# Patient Record
Sex: Female | Born: 1955 | Race: Black or African American | Hispanic: No | Marital: Single | Smoking: Never smoker
Health system: Southern US, Community
[De-identification: ages and names within clinical notes are randomized; demographics above are authoritative.]

---

## 2003-02-01 ENCOUNTER — Encounter: Payer: Self-pay | Admitting: Neurology

## 2003-02-01 ENCOUNTER — Inpatient Hospital Stay (HOSPITAL_COMMUNITY): Admission: EM | Admit: 2003-02-01 | Discharge: 2003-02-03 | Payer: Self-pay | Admitting: Emergency Medicine

## 2003-02-02 ENCOUNTER — Encounter (INDEPENDENT_AMBULATORY_CARE_PROVIDER_SITE_OTHER): Payer: Self-pay | Admitting: Cardiology

## 2003-02-09 ENCOUNTER — Encounter: Admission: RE | Admit: 2003-02-09 | Discharge: 2003-02-21 | Payer: Self-pay | Admitting: Neurology

## 2004-06-12 ENCOUNTER — Encounter: Admission: RE | Admit: 2004-06-12 | Discharge: 2004-06-12 | Payer: Self-pay | Admitting: Family Medicine

## 2005-04-14 ENCOUNTER — Other Ambulatory Visit: Admission: RE | Admit: 2005-04-14 | Discharge: 2005-04-14 | Payer: Self-pay | Admitting: Family Medicine

## 2006-09-28 ENCOUNTER — Other Ambulatory Visit: Admission: RE | Admit: 2006-09-28 | Discharge: 2006-09-28 | Payer: Self-pay | Admitting: Family Medicine

## 2006-10-05 ENCOUNTER — Encounter: Admission: RE | Admit: 2006-10-05 | Discharge: 2006-10-05 | Payer: Self-pay | Admitting: Family Medicine

## 2007-09-23 ENCOUNTER — Other Ambulatory Visit: Admission: RE | Admit: 2007-09-23 | Discharge: 2007-09-23 | Payer: Self-pay | Admitting: Family Medicine

## 2007-11-09 ENCOUNTER — Encounter: Admission: RE | Admit: 2007-11-09 | Discharge: 2007-11-09 | Payer: Self-pay | Admitting: Family Medicine

## 2008-12-19 ENCOUNTER — Encounter: Admission: RE | Admit: 2008-12-19 | Discharge: 2008-12-19 | Payer: Self-pay | Admitting: Family Medicine

## 2010-09-05 ENCOUNTER — Other Ambulatory Visit: Admission: RE | Admit: 2010-09-05 | Discharge: 2010-09-05 | Payer: Self-pay | Admitting: Family Medicine

## 2011-01-19 ENCOUNTER — Encounter: Payer: Self-pay | Admitting: Family Medicine

## 2011-05-16 NOTE — H&P (Signed)
NAME:  Kaylee Hughes, Kaylee Hughes                        ACCOUNT NO.:  [id number]   MEDICAL RECORD NO.:  [id number]                   PATIENT TYPE:  EMS   LOCATION:  MAJO                                 FACILITY:  [facility]   PHYSICIAN:  Genene Churn. Love, M.D.                 DATE OF BIRTH:  [birth year]   DATE OF ADMISSION:  02/01/2003  DATE OF DISCHARGE:                                HISTORY & PHYSICAL   DATE OF BIRTH:  Jan 26, 1956   CHIEF COMPLAINT:  This is the second [facility] admission for this  55 year-old right handed black single female from [place], [place]  admitted from the emergency room for evaluation of dysarthric and a  suspected stroke.   HISTORY OF PRESENT ILLNESS:  This patient has a greater than ten year  history of hypertension and a 30 year history of cigarette use.  In 1997 she  developed right sided weakness from a left brain stroke and was admitted by  Dr. Amada Jupiter Dreiling and followed by him until approximately two years ago.  She had excellent recovery of her symptomatology, but approximately one year  to six months ago discontinued medications.  This day about 7:30 a.m. while  at work, she noted the onset of dysarthria with left hand tingling and  weakness unassociated with headache, seizure, syncope, chest pain or  palpitations.  There is no history of head or neck trauma, drug or alcohol  abuse.  She had not been taking aspirin and comes to the emergency room for  further evaluation.   PAST MEDICAL HISTORY:  1. Ten year history of hypertension.  2. Chronic obstructive pulmonary disease with a 30 year history of one     quarter pack per day cigarette use.  3. Left brain stroke in 1997.   EDUCATION:  She went through the twelfth grade school.  She works as a  Engineer, agricultural at [employer].  She smokes a quarter  pack per day of cigarettes which she has done for 30 years.   ALLERGIES:  She has no known history of  allergies.   HABITS:  She does not drink alcohol.   MEDICATIONS:  She is currently on no medications.   FAMILY HISTORY:  Her mother died at 62 from Alzheimer's disease.  Her father  died in his 58's of unknown causes.  She has one brother who is 39, another  brother who is 68 and sisters who are 55 and 28 who are living and well.  She has four children of her own; daughters are 55, 75 and 59 and a son 8,  living and well.   PHYSICAL EXAMINATION:  GENERAL:  This is a well developed pleasant female  with marked dysarthria who appeared her stated age.  VITAL SIGNS:  Lying blood pressure in the right and the left arms was  195/100, heart rate 90 and regular  and she is afebrile.  HEENT:  There is a loud left supraclavicular bruit heard.  The tympanic  membranes were clear.  Neck flexion was intact.  MENTAL STATUS:  She is alert and oriented times three.  There was no  aphasia, agnosia or apraxia.  There was no denial of the left side of her  body.  She followed one, two and three step commands.  Her cranial nerve  examination revealed visual fields to be full.  Discs are flat.  Spontaneous  venous pulsations are seen.  The left palpebra is larger than the right.  Dysarthria.  Tongue is midline.  Uvula midline. Gag is present.  The  sternocleidomastoid and trapezius are normal.  MOTOR EXAMINATION:  Mild left hand extensor drift, otherwise strength  testing is approximately 4++/5 on the left.  SENSORY EXAMINATION:  Intact to pinprick, position and vibration testing.  Deep tendon reflexes are 2+.  Plantar responses are downgoing.  Gait was not  tested.   GENERAL EXAMINATION:  HEENT:  Tympanic membranes are clear.  Mouth is in  good repair.  Tongue is midline.  LUNGS:  Clear to auscultation.  CARDIOVASCULAR:  Examination of her heart revealed no murmurs.  BREASTS:  Normal.  ABDOMEN:  Normal with normal bowel sounds.   LABORATORY DATA:  CT scan showed an old right frontal and left head  of  caudate infarcts extending into the white matter on the left side which is  very large.  She had right greater than left cerebellar ischemic strokes.   EKG showed normal sinus rhythm and was a normal EKG.   White blood cell count is 9,700, hemoglobin 14.1, hematocrit 40.5, platelets  293,000.  Sodium 139, potassium 3.4, chloride 106, C02 content 26, BUN 8,  creatinine 0.7, glucose 96.  PT 13.2, INR 1.0, PTT 33.0.   IMPRESSION:  1. Right brain stroke (code 424.01).  2. Left brain stroke in 1997 (code 424.01).  3. Bicerebral small vessel ischemic strokes on CT scan and possibly large     vessel as well (code 433.31).  4. Hypertension (code 796.2).  5. Chronic obstructive pulmonary disease with a quarter pack per day     cigarette use times 30 years (code 496).  6. Left supraclavicular bruit (code 785.9).   PLAN:  The plan at this time is to admit the patient for further evaluation  to include an MRI, intracranial MRA Doppler, 2D echocardiogram and place the  patient on aspirin and control her blood pressure with diuretics at this  time.                                               Genene Churn. Sandria Manly, M.D.    JML/MEDQ  D:  02/01/2003  T:  02/01/2003  Job:  [id number]

## 2011-05-16 NOTE — Discharge Summary (Signed)
NAME:  Kaylee Hughes, Kaylee Hughes                        ACCOUNT NO.:  [id number]   MEDICAL RECORD NO.:  [id number]                   PATIENT TYPE:  INP   LOCATION:  3015                                 FACILITY:  [facility]   PHYSICIAN:  Gustavus Messing. Orlin Hilding, M.D.          DATE OF BIRTH:  1956/05/06   DATE OF ADMISSION:  02/01/2003  DATE OF DISCHARGE:  02/03/2003                                 DISCHARGE SUMMARY   ADMITTING DIAGNOSES:  1. Stroke, right brain, suspected.  2. History of small-vessel disease and remote left brain stroke.  3. Hypertension.   DISCHARGE DIAGNOSES:  1. 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  2. Old bicerebral strokes.  3. Hypertension.  4. Chronic obstructive pulmonary disease.  5. Left supraclavicular bruit.   MEDICATIONS ON DISCHARGE:  1. Hydrochlorothiazide 25 mg one a day.  2. Aspirin 325 mg one a day.  3. Potassium chloride 20 mEq one a day.   PAIN MANAGEMENT:  Not applicable.   ACTIVITY:  The patient should remain out of work until she has seen Dr. Sandria Manly  in two to three weeks.   DIET ON DISCHARGE:  Dysphagia III thin liquid.   DISCHARGE INSTRUCTIONS:  1. Speech and swallow evaluation at [facility] outpatient rehab for follow-up and     evaluation at [facility] for primary care.  2. No smoking.  She may use over-the-counter smoking cessation aids.   FOLLOW-UP:  With Dr. Sandria Manly with [facility] in two to three weeks;  phone number given to the patient.   PROCEDURES:  1. MRI scan of the brain which showed an acute right thalamic stroke and     small-vessel disease.  The thalamic stroke is a lacunar infarct.  There     is also a left basal ganglia periventricular centrum semiovale infarct     which is old.  2. Carotid Doppler studies were done which were negative for stenosis.  3. A 2-D echo was done which showed some left ventricular hypertrophy, left     ventricular ejection fraction of 65-75% which is excellent; no  wall     abnormality, no wall motion abnormality, and no embolus or clot seen.   LABORATORY DATA:  Elevated cholesterol 233 - although that was random.  HDL  was 54, LDL was 155.  She will be on a low cholesterol diet and that will  need to be followed as an outpatient.  CBC was completely normal:  White  blood cell 9.7, hemoglobin 14.1, hematocrit 40.5, platelets 293.  PT 13.2,  INR 1.0, PTT 33.  Routine chemistries:  BMET was normal except slightly low  potassium at 3.4.  Homocysteine was within normal limits at 10.88.  Urinalysis did show some nitrites and esterase but did not show abnormal  white blood cells.  She had a negative drugs of abuse screen.   HOSPITAL COURSE:  Please see History and Physical for details.  Briefly,  this is a 55 year old woman with a history of hypertension who is not being  treated who presented with dysarthria, possible stroke.  She had a previous  history of a stroke in 1997, was followed by a primary physician for two  years, and then discontinued her care there.  She was admitted when she  developed left hand tingling and weakness with dysarthria.  MRI demonstrated  an acute stroke.  She was seen by speech and language pathology and put on  modified diet.  Workup to exclude embolic sources from the carotid or from  the heart were done and those were negative.  Everything was consistent with  small-vessel disease; she had not been taking any antiplatelet agents.  She  was placed on aspirin.  She is to follow up at [facility] for primary  care.  Her diet will be dysphagia III with thin liquids, low cholesterol,  low fat.  She will need to follow up with the [facility] rehab outpatient center  for follow-up swallow and speech, and follow-up with Dr. Sandria Manly as already  noted.  She is discharged in improved condition, ambulatory, with mild  dysarthria and dysphagia.                                               Catherine A. Orlin Hilding, M.D.    CAW/MEDQ  D:   02/03/2003  T:  02/04/2003  Job:  [id number]

## 2012-09-20 ENCOUNTER — Encounter (HOSPITAL_COMMUNITY): Payer: Self-pay | Admitting: *Deleted

## 2012-09-28 ENCOUNTER — Ambulatory Visit (HOSPITAL_COMMUNITY): Payer: Self-pay

## 2012-09-28 ENCOUNTER — Ambulatory Visit (HOSPITAL_COMMUNITY): Payer: Self-pay | Attending: Obstetrics and Gynecology

## 2012-09-28 ENCOUNTER — Other Ambulatory Visit: Payer: Self-pay | Admitting: Obstetrics and Gynecology

## 2012-09-28 DIAGNOSIS — Z1231 Encounter for screening mammogram for malignant neoplasm of breast: Secondary | ICD-10-CM

## 2014-08-01 ENCOUNTER — Other Ambulatory Visit: Payer: Self-pay | Admitting: Dermatology

## 2019-09-01 ENCOUNTER — Ambulatory Visit
Admission: RE | Admit: 2019-09-01 | Discharge: 2019-09-01 | Disposition: A | Discharge status: Home / Self Care | Payer: BC Managed Care – PPO | Source: Ambulatory Visit | Attending: Family Medicine | Admitting: Family Medicine

## 2019-09-01 ENCOUNTER — Other Ambulatory Visit: Payer: Self-pay

## 2019-09-01 ENCOUNTER — Other Ambulatory Visit: Payer: Self-pay | Admitting: Family Medicine

## 2019-09-01 DIAGNOSIS — M25361 Other instability, right knee: Secondary | ICD-10-CM

## 2019-09-06 ENCOUNTER — Other Ambulatory Visit: Payer: Self-pay

## 2019-09-06 ENCOUNTER — Emergency Department (HOSPITAL_COMMUNITY)
Admission: EM | Admit: 2019-09-06 | Discharge: 2019-09-07 | Disposition: A | Discharge status: Home / Self Care | Payer: BLUE CROSS/BLUE SHIELD | Attending: Emergency Medicine | Admitting: Emergency Medicine

## 2019-09-06 ENCOUNTER — Encounter (HOSPITAL_COMMUNITY): Payer: Self-pay | Admitting: Emergency Medicine

## 2019-09-06 DIAGNOSIS — K222 Esophageal obstruction: Secondary | ICD-10-CM

## 2019-09-06 DIAGNOSIS — Y9389 Activity, other specified: Secondary | ICD-10-CM | POA: Insufficient documentation

## 2019-09-06 DIAGNOSIS — Y999 Unspecified external cause status: Secondary | ICD-10-CM | POA: Diagnosis not present

## 2019-09-06 DIAGNOSIS — T18128A Food in esophagus causing other injury, initial encounter: Secondary | ICD-10-CM | POA: Diagnosis not present

## 2019-09-06 DIAGNOSIS — X58XXXA Exposure to other specified factors, initial encounter: Secondary | ICD-10-CM | POA: Diagnosis not present

## 2019-09-06 DIAGNOSIS — Y92011 Dining room of single-family (private) house as the place of occurrence of the external cause: Secondary | ICD-10-CM | POA: Diagnosis not present

## 2019-09-06 NOTE — ED Notes (Signed)
Pt tolerated PO intake. Reports no pain or discomfort, states she is ready to go home.

## 2019-09-06 NOTE — ED Triage Notes (Signed)
C/O of food bolus. Pt swallowed piece of steak this evening and wasn't able to get It down completely.

## 2019-09-06 NOTE — ED Provider Notes (Signed)
TIME SEEN: 11:00 PM  CHIEF COMPLAINT: Food bolus  HPI: Patient is a 63 year old female with no significant past medical history who presents to the emergency department with esophageal food bolus.  States around 7 PM she was eating steak when she felt to get stuck in her esophagus.  Was unable to swallow her own saliva afterwards.  Waited a couple of hours at home before coming to the ED.  Has never had this happen to her before.  She has never had an endoscopy.  Has never seen gastroenterology.  No difficulty breathing.  No abdominal pain.  While in the waiting room, symptoms spontaneously resolved and she feels she can swallow her own secretions.  ROS: See HPI Constitutional: no fever  Eyes: no drainage  ENT: no runny nose   Cardiovascular:  no chest pain  Resp: no SOB  GI: no vomiting GU: no dysuria Integumentary: no rash  Allergy: no hives  Musculoskeletal: no leg swelling  Neurological: no slurred speech ROS otherwise negative  PAST MEDICAL HISTORY/PAST SURGICAL HISTORY:  History reviewed. No pertinent past medical history.  MEDICATIONS:  Prior to Admission medications   Not on File    ALLERGIES:  No Known Allergies  SOCIAL HISTORY:  Social History   Tobacco Use  . Smoking status: Never Smoker  . Smokeless tobacco: Never Used  Substance Use Topics  . Alcohol use: Not Currently    FAMILY HISTORY: History reviewed. No pertinent family history.  EXAM: BP 132/70 (BP Location: Right Arm)   Pulse 63   Temp 98.8 F (37.1 C) (Oral)   Resp 16   SpO2 98%  CONSTITUTIONAL: Alert and oriented and responds appropriately to questions. Well-appearing; well-nourished HEAD: Normocephalic EYES: Conjunctivae clear, pupils appear equal, EOMI ENT: normal nose; moist mucous membranes; No pharyngeal erythema or petechiae, no tonsillar hypertrophy or exudate, no uvular deviation, no unilateral swelling, no trismus or drooling, no muffled voice, normal phonation, no stridor, no  dental caries present, no drainable dental abscess noted, no Ludwig's angina, tongue sits flat in the bottom of the mouth, no angioedema, no facial erythema or warmth, no facial swelling; no pain with movement of the neck. NECK: Supple, no meningismus, no nuchal rigidity, no LAD  CARD: RRR; S1 and S2 appreciated; no murmurs, no clicks, no rubs, no gallops RESP: Normal chest excursion without splinting or tachypnea; breath sounds clear and equal bilaterally; no wheezes, no rhonchi, no rales, no hypoxia or respiratory distress, speaking full sentences ABD/GI: Normal bowel sounds; non-distended; soft, non-tender, no rebound, no guarding, no peritoneal signs, no hepatosplenomegaly BACK:  The back appears normal and is non-tender to palpation, there is no CVA tenderness EXT: Normal ROM in all joints; non-tender to palpation; no edema; normal capillary refill; no cyanosis, no calf tenderness or swelling    SKIN: Normal color for age and race; warm; no rash NEURO: Moves all extremities equally PSYCH: The patient's mood and manner are appropriate. Grooming and personal hygiene are appropriate.  MEDICAL DECISION MAKING: Patient here with esophageal food bolus that has resolved.  She is able to drink Sprite in the room without any difficulty and no gagging, vomiting, regurgitation.  I feel she is safe to be discharged home.  Recommended soft diet for the next several days and follow-up with GI if this reoccurs.  Discussed return precautions.  Patient and daughter are comfortable with this plan.  She is currently asymptomatic.  At this time, I do not feel there is any life-threatening condition present. I have reviewed  and discussed all results (EKG, imaging, lab, urine as appropriate) and exam findings with patient/family. I have reviewed nursing notes and appropriate previous records.  I feel the patient is safe to be discharged home without further emergent workup and can continue workup as an outpatient as  needed. Discussed usual and customary return precautions. Patient/family verbalize understanding and are comfortable with this plan.  Outpatient follow-up has been provided as needed. All questions have been answered.      Ward, Delice Bison, DO 09/06/19 [id number]

## 2019-09-07 NOTE — Discharge Instructions (Signed)
I recommend soft diet for 3 days and then slowly advance.  Please follow up with GI if you have recurrent symptoms as you may need an endoscopy.  If this occurs again at home and unrelieved and you are unable to swallow your own saliva, please return to the ED.

## 2019-09-07 NOTE — ED Notes (Signed)
All appropriate discharge materials reviewed with patient at length. Time for questions provided. Pt denies any further questions at this time. Verbalizes understanding of all provided materials.  

## 2019-09-14 ENCOUNTER — Other Ambulatory Visit: Payer: Self-pay | Admitting: Family Medicine

## 2019-09-14 DIAGNOSIS — Z1231 Encounter for screening mammogram for malignant neoplasm of breast: Secondary | ICD-10-CM

## 2020-01-13 ENCOUNTER — Ambulatory Visit: Payer: BLUE CROSS/BLUE SHIELD | Attending: Internal Medicine

## 2020-01-13 DIAGNOSIS — Z20822 Contact with and (suspected) exposure to covid-19: Secondary | ICD-10-CM

## 2020-01-14 LAB — NOVEL CORONAVIRUS, NAA: SARS-CoV-2, NAA: NOT DETECTED

## 2020-01-27 ENCOUNTER — Ambulatory Visit: Payer: BLUE CROSS/BLUE SHIELD | Attending: Internal Medicine

## 2020-01-27 DIAGNOSIS — Z20822 Contact with and (suspected) exposure to covid-19: Secondary | ICD-10-CM

## 2020-01-28 LAB — NOVEL CORONAVIRUS, NAA: SARS-CoV-2, NAA: NOT DETECTED

## 2021-01-31 IMAGING — CR DG KNEE COMPLETE 4+V*R*
4 series · 4 of 4 positions shown · non-contrast
Comparison: None.

CLINICAL DATA: Right knee pain for 2 months without known injury.

EXAM:
RIGHT KNEE - COMPLETE 4+ VIEW

[w knee ap right]
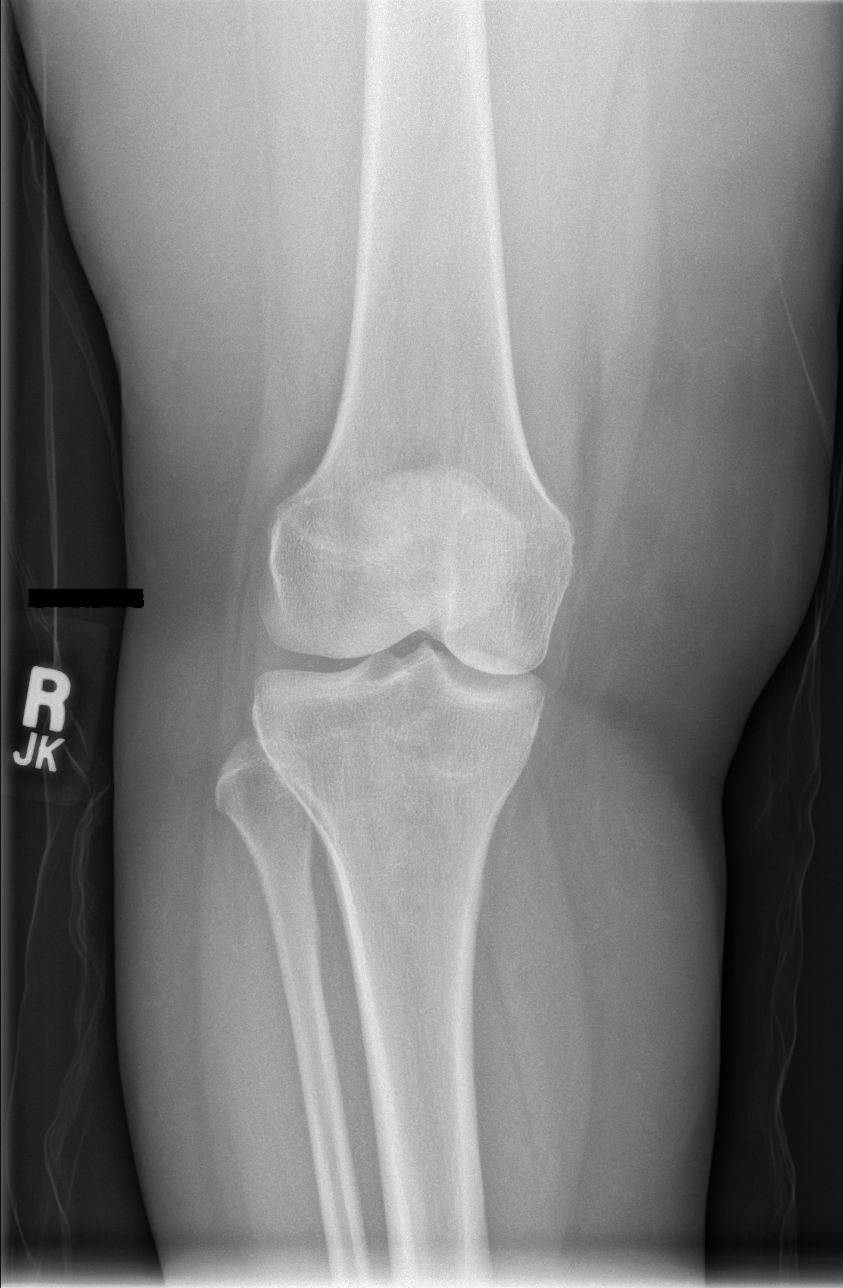

[w knee lat right (1 of 2)]
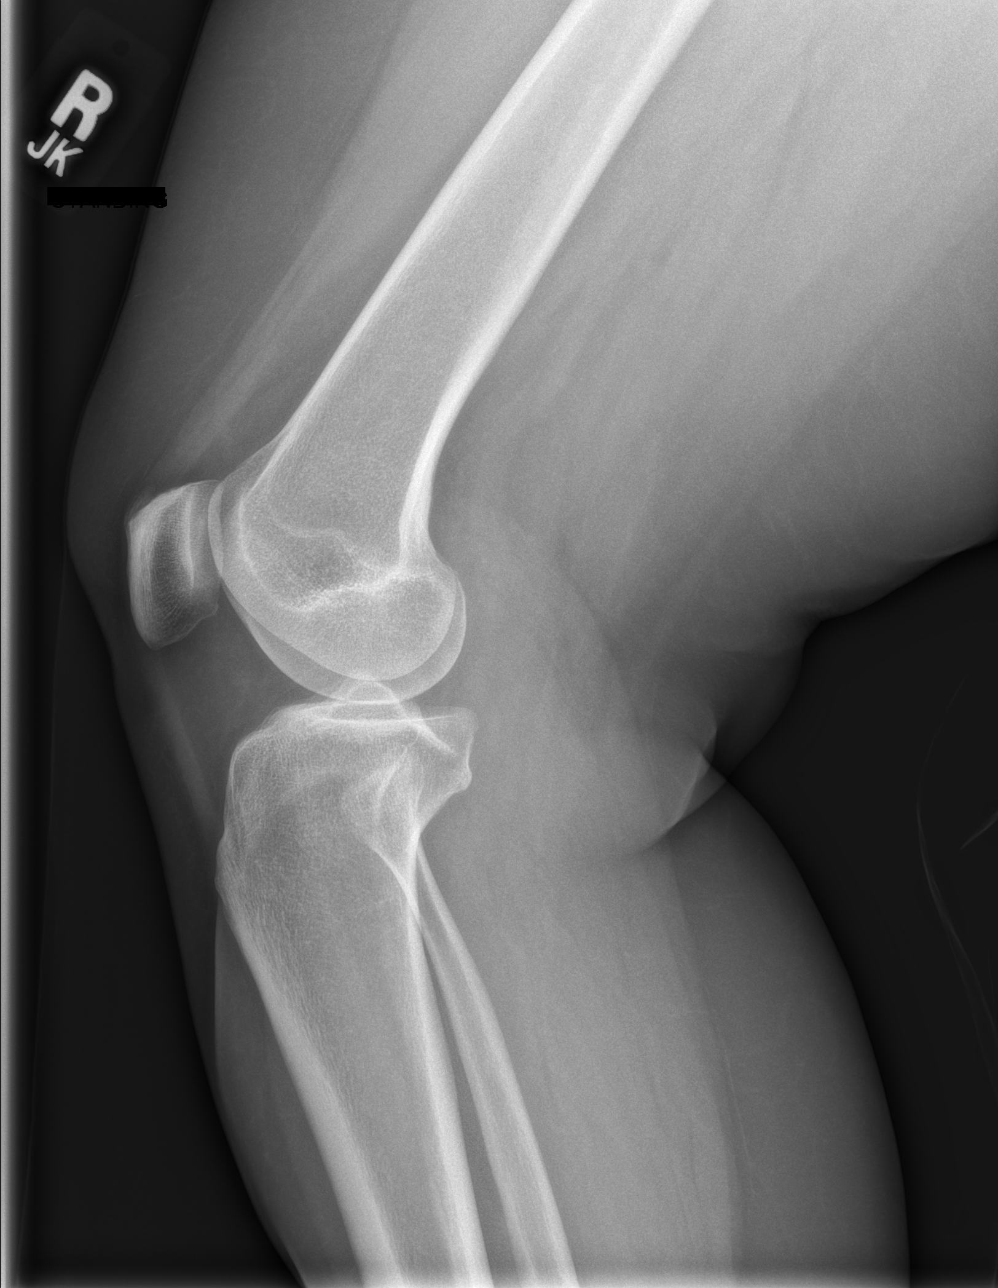

[w knee lat right (2 of 2)]
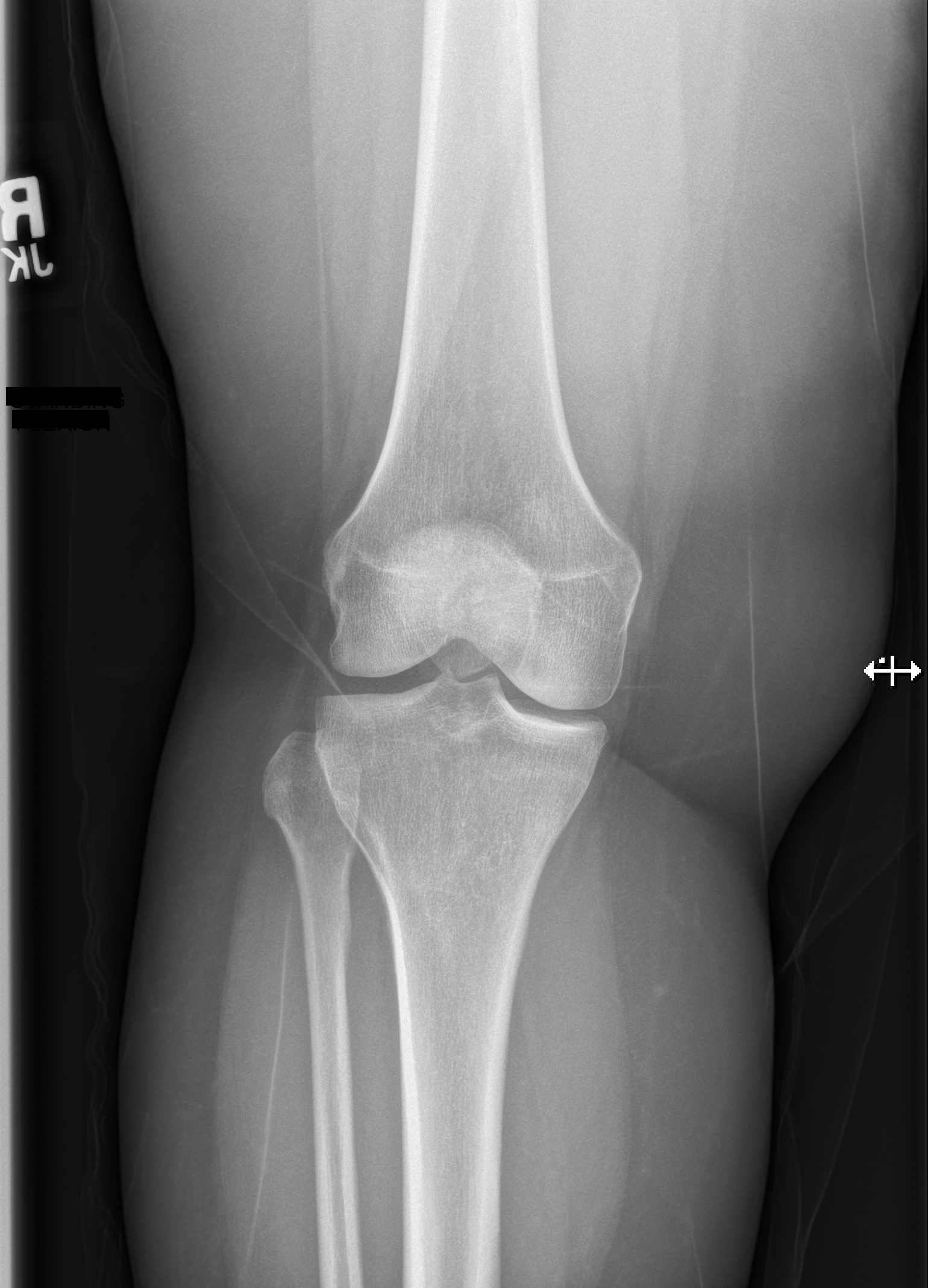

[x knee sunrise right]
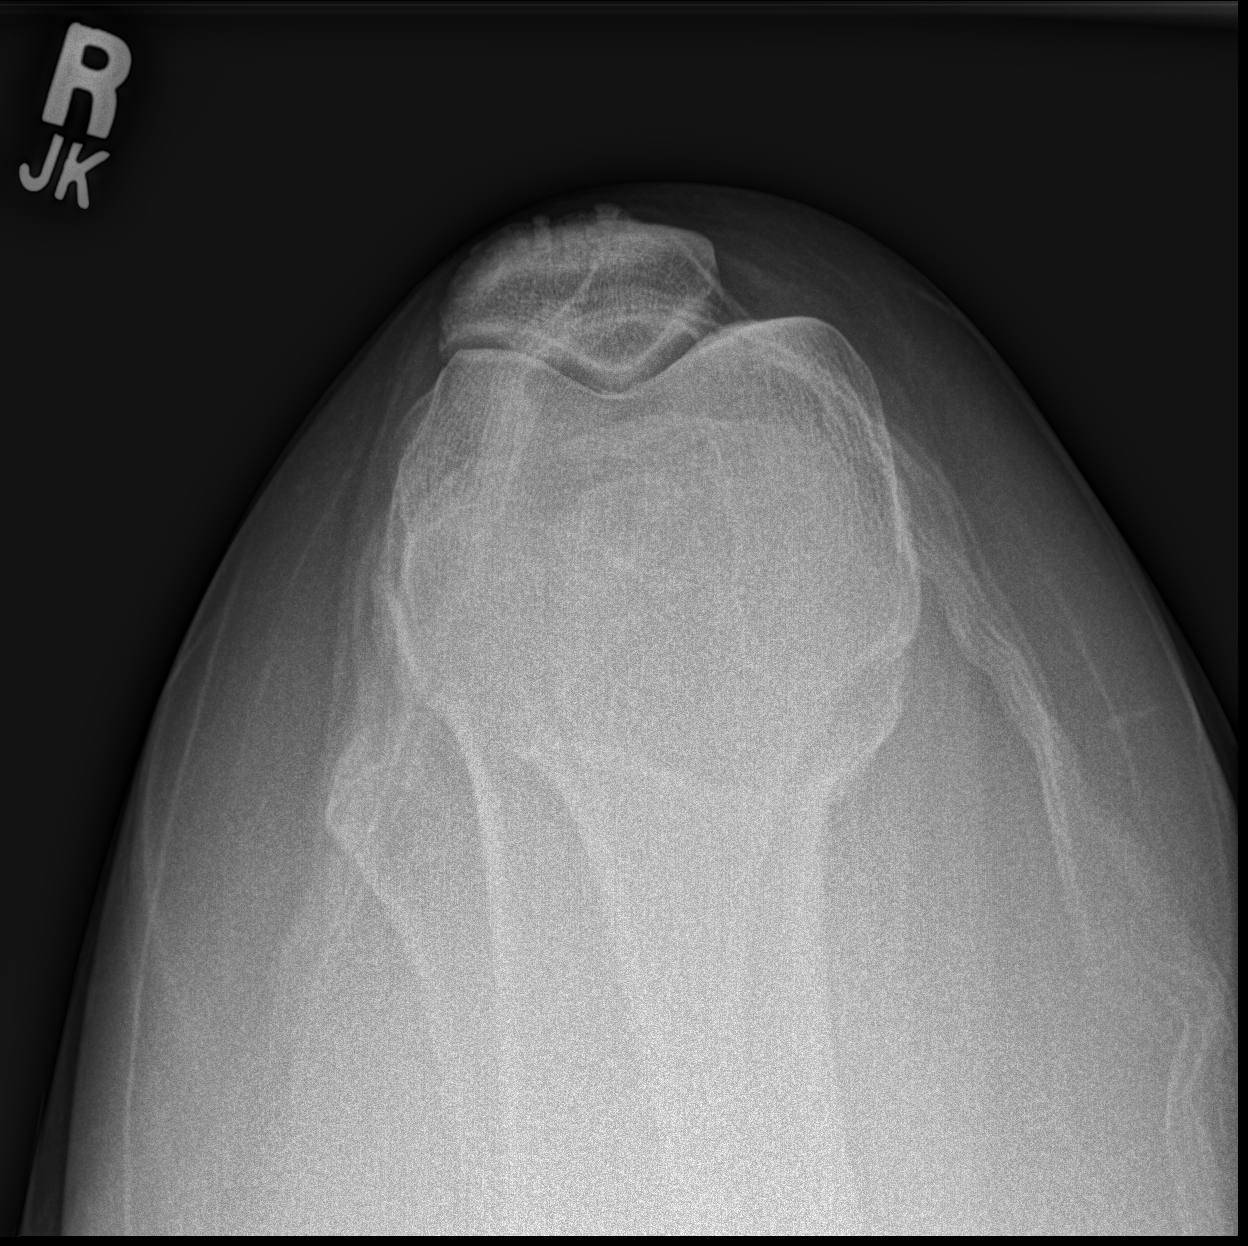

[4 of 4 positions shown; findings below may reference images not displayed]

FINDINGS: No evidence of fracture, dislocation, or joint effusion. No evidence
of arthropathy or other focal bone abnormality. Soft tissues are
unremarkable.
IMPRESSION: Negative.
